# Patient Record
Sex: Male | Born: 2000 | Race: White | Hispanic: No | Marital: Single | Smoking: Current every day smoker
Health system: Southern US, Community
[De-identification: ages and names within clinical notes are randomized; demographics above are authoritative.]

## PROBLEM LIST (undated history)

## (undated) DIAGNOSIS — M21129 Varus deformity, not elsewhere classified, unspecified elbow: Secondary | ICD-10-CM

## (undated) DIAGNOSIS — Q7150 Longitudinal reduction defect of unspecified ulna: Secondary | ICD-10-CM

## (undated) DIAGNOSIS — D169 Benign neoplasm of bone and articular cartilage, unspecified: Secondary | ICD-10-CM

## (undated) DIAGNOSIS — F909 Attention-deficit hyperactivity disorder, unspecified type: Secondary | ICD-10-CM

## (undated) DIAGNOSIS — A4902 Methicillin resistant Staphylococcus aureus infection, unspecified site: Secondary | ICD-10-CM

## (undated) HISTORY — DX: Benign neoplasm of bone and articular cartilage, unspecified: D16.9

## (undated) HISTORY — DX: Longitudinal reduction defect of unspecified ulna: Q71.50

## (undated) HISTORY — DX: Varus deformity, not elsewhere classified, unspecified elbow: M21.129

## (undated) HISTORY — DX: Methicillin resistant Staphylococcus aureus infection, unspecified site: A49.02

---
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
## 2022-07-27 ENCOUNTER — Emergency Department (HOSPITAL_COMMUNITY)
Admission: EM | Admit: 2022-07-27 | Discharge: 2022-07-27 | Disposition: A | Discharge status: Home / Self Care | Payer: BC Managed Care – PPO | Attending: Emergency Medicine | Admitting: Emergency Medicine

## 2022-07-27 ENCOUNTER — Other Ambulatory Visit: Payer: Self-pay

## 2022-07-27 ENCOUNTER — Encounter (HOSPITAL_COMMUNITY): Payer: Self-pay

## 2022-07-27 DIAGNOSIS — Z20822 Contact with and (suspected) exposure to covid-19: Secondary | ICD-10-CM | POA: Insufficient documentation

## 2022-07-27 DIAGNOSIS — J029 Acute pharyngitis, unspecified: Secondary | ICD-10-CM | POA: Diagnosis present

## 2022-07-27 DIAGNOSIS — J020 Streptococcal pharyngitis: Secondary | ICD-10-CM

## 2022-07-27 LAB — RESP PANEL BY RT-PCR (FLU A&B, COVID) ARPGX2
Influenza A by PCR: NEGATIVE
Influenza B by PCR: NEGATIVE
SARS Coronavirus 2 by RT PCR: NEGATIVE

## 2022-07-27 LAB — GROUP A STREP BY PCR: Group A Strep by PCR: DETECTED — AB

## 2022-07-27 MED ORDER — PENICILLIN V POTASSIUM 500 MG PO TABS: 500.0000 mg | ORAL_TABLET | Freq: Four times a day (QID) | ORAL | 0 refills | Status: AC

## 2022-07-27 MED ORDER — PENICILLIN V POTASSIUM 250 MG PO TABS
500.0000 mg | ORAL_TABLET | Freq: Once | ORAL | Status: AC
  Administered 2022-07-27: 500 mg via ORAL
  Filled 2022-07-27: qty 2

## 2022-07-27 MED ORDER — PENICILLIN G BENZATHINE 1200000 UNIT/2ML IM SUSY
1.2000 10*6.[IU] | PREFILLED_SYRINGE | Freq: Once | INTRAMUSCULAR | Status: DC
  Filled 2022-07-27: qty 2

## 2022-07-27 NOTE — Discharge Instructions (Addendum)
Please take penicillin as prescribed.

## 2022-07-27 NOTE — ED Provider Notes (Signed)
[facility] EMERGENCY DEPARTMENT Provider Note   CSN: [id number] Arrival date & time: 07/27/22  2119     History  Chief Complaint  Patient presents with   Sore Throat    Zachary Chavez is a 21 y.o. male.   Sore Throat  Patient is a 21 year old male with past medical history of ADHD presents to the ER for evaluation of sore throat.  Patient states he has had sore throat and fever for 2 days.  He reports mild cough with greenish to brown sputum.  He denies any chest pain, shortness of breath, nausea, vomiting, constipation, diarrhea, rash, muscle aching.  He has been around 2 roommates who reportedly had been sick.  He reports taking ibuprofen for fever.     Home Medications Prior to Admission medications   Not on File      Allergies    Patient has no known allergies.    Review of Systems   Review of Systems  HENT:  Positive for sore throat.   Respiratory:  Positive for cough.     Physical Exam Updated Vital Signs BP 119/69 (BP Location: Right Arm)   Pulse 99   Temp 99.8 F (37.7 C) (Oral)   Resp 20   Ht '6\' 1"'$  (1.854 m)   Wt 76 kg   SpO2 93%   BMI 22.11 kg/m  Physical Exam Vitals and nursing note reviewed.  Constitutional:      General: He is not in acute distress.    Appearance: He is well-developed.  HENT:     Head: Normocephalic and atraumatic.     Mouth/Throat:     Mouth: Mucous membranes are moist.     Tonsils: Tonsillar exudate present.     Comments: Anterior cervical lymph nodes swelling. Eyes:     Conjunctiva/sclera: Conjunctivae normal.  Neck:     Comments: Anterior cervical lymph nodes swelling Cardiovascular:     Rate and Rhythm: Normal rate and regular rhythm.     Heart sounds: No murmur heard. Pulmonary:     Effort: Pulmonary effort is normal. No respiratory distress.     Breath sounds: Normal breath sounds.  Abdominal:     Palpations: Abdomen is soft.     Tenderness: There is no abdominal tenderness.  Musculoskeletal:         General: No swelling.     Cervical back: Neck supple.  Skin:    General: Skin is warm and dry.     Capillary Refill: Capillary refill takes less than 2 seconds.  Neurological:     Mental Status: He is alert.  Psychiatric:        Mood and Affect: Mood normal.     ED Results / Procedures / Treatments   Labs (all labs ordered are listed, but only abnormal results are displayed) Labs Reviewed  GROUP A STREP BY PCR - Abnormal; Notable for the following components:      Result Value   Group A Strep by PCR DETECTED (*)    All other components within normal limits  RESP PANEL BY RT-PCR (FLU A&B, COVID) [id number]    EKG None  Radiology No results found.  Procedures Procedures    Medications Ordered in ED Medications - No data to display  ED Course/ Medical Decision Making/ A&P                           Medical Decision Making  This patient presents to the ED  for concern of sore throat and fever, this involves an extensive number of treatment options, and is a complaint that carries with it a high risk of complications and morbidity.  The differential diagnosis includes group A strep, COVID, flu.   Co morbidities that complicate the patient evaluation  none    Lab Tests:  I Ordered, and personally interpreted labs.  The pertinent results include: Positive strep test.  Negative flu and COVID test.   Imaging Studies ordered:  none   Cardiac Monitoring: / EKG:  The patient was maintained on a cardiac monitor.  I personally viewed and interpreted the cardiac monitored which showed an underlying rhythm of: Normal sinus   Consultations Obtained:  none   Problem List / ED Course / Critical interventions / Medication management  Patient presenting with mild cough, anterior cervical lymphadenopathy, tonsillar exudate and positive strep test.  Diagnosis is streptococcal pharyngitis. I ordered medication including penicillin V 500 mg for strep a pharyngitis I  have reviewed the patients home medicines and have made adjustments as needed   Test / Admission - Considered:  none         Final Clinical Impression(s) / ED Diagnoses Final diagnoses:  None    Rx / DC Orders ED Discharge Orders     None         Rex Kras, [place] 07/27/22 2318    Noemi Chapel, MD 07/28/22 2234

## 2022-07-27 NOTE — ED Triage Notes (Signed)
Sore throat for 2 days. Roommates have been sick

## 2022-12-09 ENCOUNTER — Encounter (HOSPITAL_COMMUNITY): Payer: Self-pay

## 2022-12-09 ENCOUNTER — Other Ambulatory Visit: Payer: Self-pay

## 2022-12-09 DIAGNOSIS — S0990XA Unspecified injury of head, initial encounter: Secondary | ICD-10-CM | POA: Diagnosis present

## 2022-12-09 DIAGNOSIS — S060X0A Concussion without loss of consciousness, initial encounter: Secondary | ICD-10-CM | POA: Insufficient documentation

## 2022-12-09 DIAGNOSIS — W228XXA Striking against or struck by other objects, initial encounter: Secondary | ICD-10-CM | POA: Diagnosis not present

## 2022-12-09 NOTE — ED Triage Notes (Addendum)
Pt reports he hit his head on a metal awning last night and has had a headache and light sensitivity since he woke up this morning. Approximately 1 inch superficial laceration noted to top of head.

## 2022-12-10 ENCOUNTER — Emergency Department (HOSPITAL_COMMUNITY)
Admission: EM | Admit: 2022-12-10 | Discharge: 2022-12-10 | Disposition: A | Discharge status: Home / Self Care | Payer: BC Managed Care – PPO | Attending: Emergency Medicine | Admitting: Emergency Medicine

## 2022-12-10 DIAGNOSIS — S060X0A Concussion without loss of consciousness, initial encounter: Secondary | ICD-10-CM

## 2022-12-10 DIAGNOSIS — S0990XA Unspecified injury of head, initial encounter: Secondary | ICD-10-CM

## 2022-12-10 MED ORDER — OXYCODONE-ACETAMINOPHEN 5-325 MG PO TABS
2.0000 | ORAL_TABLET | Freq: Once | ORAL | Status: AC
  Administered 2022-12-10: 2 via ORAL
  Filled 2022-12-10: qty 2

## 2022-12-10 MED ORDER — KETOROLAC TROMETHAMINE 60 MG/2ML IM SOLN
30.0000 mg | Freq: Once | INTRAMUSCULAR | Status: AC
  Administered 2022-12-10: 30 mg via INTRAMUSCULAR
  Filled 2022-12-10: qty 2

## 2022-12-10 NOTE — ED Provider Notes (Signed)
  Standard Provider Note   CSN: [id number] Arrival date & time: 12/09/22  1939     History  Chief Complaint  Patient presents with   Head Injury    Zachary Chavez is a 22 y.o. male.  22 year old male that hit his head on a couple days ago.  Woke up the next morning with nausea, vomiting and severe headache with photophobia and worsening headache with sound.  Thinks his tetanus is up-to-date and high school years ago.  No trauma anywhere else.  No persistent vomiting or neurologic symptoms.  No other associated symptoms.   Head Injury      Home Medications Prior to Admission medications   Not on File      Allergies    Patient has no known allergies.    Review of Systems   Review of Systems  Physical Exam Updated Vital Signs BP 119/62 (BP Location: Right Arm)   Pulse 88   Temp 98.3 F (36.8 C) (Oral)   Resp 16   Ht '6\' 3"'$  (1.905 m)   Wt 72.6 kg   SpO2 100%   BMI 20.00 kg/m  Physical Exam Vitals and nursing note reviewed.  Constitutional:      Appearance: He is well-developed.  HENT:     Head: Normocephalic.     Comments: Abrasion to scalp Eyes:     Pupils: Pupils are equal, round, and reactive to light.  Cardiovascular:     Rate and Rhythm: Normal rate.  Pulmonary:     Effort: Pulmonary effort is normal. No respiratory distress.  Abdominal:     General: Abdomen is flat. There is no distension.  Musculoskeletal:        General: Normal range of motion.     Cervical back: Normal range of motion.  Neurological:     General: No focal deficit present.     Mental Status: He is alert.     ED Results / Procedures / Treatments   Labs (all labs ordered are listed, but only abnormal results are displayed) Labs Reviewed - No data to display  EKG None  Radiology No results found.  Procedures Procedures    Medications Ordered in ED Medications  ketorolac (TORADOL) injection 30 mg (30 mg  Intramuscular Given 12/10/22 0252)  oxyCODONE-acetaminophen (PERCOCET/ROXICET) 5-325 MG per tablet 2 tablet (2 tablets Oral Given 12/10/22 0252)    ED Course/ Medical Decision Making/ A&P                             Medical Decision Making Risk Prescription drug management.   No indication for updated tetanus.  Wound too old to repair and is not a significant laceration anyway.  Probably has postconcussive syndrome.  I discussed postconcussive protocols and follow-up with his doctor.  Indication for head imaging at this time.  Final Clinical Impression(s) / ED Diagnoses Final diagnoses:  Injury of head, initial encounter  Concussion without loss of consciousness, initial encounter    Rx / DC Orders ED Discharge Orders     None         Waleska Buttery, Corene Cornea, MD 12/10/22 [phone]
